# Patient Record
Sex: Female | Born: 1990 | Race: Black or African American | Hispanic: No | Marital: Single | Smoking: Current every day smoker
Health system: Southern US, Community
[De-identification: ages and names within clinical notes are randomized; demographics above are authoritative.]

## PROBLEM LIST (undated history)

## (undated) DIAGNOSIS — F32A Depression, unspecified: Secondary | ICD-10-CM

## (undated) DIAGNOSIS — F329 Major depressive disorder, single episode, unspecified: Secondary | ICD-10-CM

## (undated) HISTORY — PX: TYMPANOSTOMY TUBE PLACEMENT: SHX32

---
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
## 2016-12-19 ENCOUNTER — Emergency Department (HOSPITAL_BASED_OUTPATIENT_CLINIC_OR_DEPARTMENT_OTHER)
Admission: EM | Admit: 2016-12-19 | Discharge: 2016-12-19 | Disposition: A | Discharge status: Home / Self Care | Payer: Self-pay | Attending: Emergency Medicine | Admitting: Emergency Medicine

## 2016-12-19 ENCOUNTER — Encounter (HOSPITAL_BASED_OUTPATIENT_CLINIC_OR_DEPARTMENT_OTHER): Payer: Self-pay | Admitting: *Deleted

## 2016-12-19 DIAGNOSIS — M545 Low back pain, unspecified: Secondary | ICD-10-CM

## 2016-12-19 DIAGNOSIS — N3000 Acute cystitis without hematuria: Secondary | ICD-10-CM | POA: Insufficient documentation

## 2016-12-19 DIAGNOSIS — F17200 Nicotine dependence, unspecified, uncomplicated: Secondary | ICD-10-CM | POA: Insufficient documentation

## 2016-12-19 DIAGNOSIS — R350 Frequency of micturition: Secondary | ICD-10-CM | POA: Insufficient documentation

## 2016-12-19 LAB — URINALYSIS, MICROSCOPIC (REFLEX): RBC / HPF: NONE SEEN RBC/hpf (ref 0–5)

## 2016-12-19 LAB — URINALYSIS, ROUTINE W REFLEX MICROSCOPIC
BILIRUBIN URINE: NEGATIVE
Glucose, UA: NEGATIVE mg/dL
Hgb urine dipstick: NEGATIVE
Ketones, ur: NEGATIVE mg/dL
NITRITE: NEGATIVE
Protein, ur: NEGATIVE mg/dL
SPECIFIC GRAVITY, URINE: 1.015 (ref 1.005–1.030)
pH: 6 (ref 5.0–8.0)

## 2016-12-19 LAB — PREGNANCY, URINE: Preg Test, Ur: NEGATIVE

## 2016-12-19 MED ORDER — CEPHALEXIN 500 MG PO CAPS: ORAL_CAPSULE | ORAL | 0 refills | Status: DC

## 2016-12-19 MED ORDER — MELOXICAM 15 MG PO TABS: 15.0000 mg | ORAL_TABLET | Freq: Every day | ORAL | 0 refills | Status: DC

## 2016-12-19 NOTE — Discharge Instructions (Signed)
SEEK IMMEDIATE MEDICAL ATTENTION IF: °New numbness, tingling, weakness, or problem with the use of your arms or legs.  °Severe back pain not relieved with medications.  °Change in bowel or bladder control.  °Increasing pain in any areas of the body (such as chest or abdominal pain).  °Shortness of breath, dizziness or fainting.  °Nausea (feeling sick to your stomach), vomiting, fever, or sweats. ° °Please seek immediate care if you develop the following: °There is back pain.  °Your symptoms are no better or worse in 3 days. °There is severe back pain or lower abdominal pain.  °You develop chills.  °You have a fever.  °There is nausea or vomiting.  °There is continued burning or discomfort with urination.  ° °

## 2016-12-19 NOTE — ED Notes (Signed)
ED Provider at bedside. 

## 2016-12-19 NOTE — ED Triage Notes (Signed)
Back pain for a week. Stiffness. No known injury.

## 2016-12-19 NOTE — ED Provider Notes (Signed)
[facility]   CSN: [id number] Arrival date & time: 12/19/16  1805     History   Chief Complaint Chief Complaint  Patient presents with  . Back Pain    HPI Darlene Thomas is a 26 y.o. female.This is the emergency department with chief complaint of low back pain. She describes the pain as achy, localized over her tailbone and an low back. Worse with movement. She cannot think of anything she may have done to strain her back. She denies dysuria but has had some increased frequency of urination. She denies fevers or chills.Denies weakness, loss of bowel/bladder function or saddle anesthesia. Denies neck stiffness, headache, rash.  Denies fever or recent procedures to back.   HPI  History reviewed. No pertinent past medical history.  There are no active problems to display for this patient.   History reviewed. No pertinent surgical history.  OB History    No data available       Home Medications    Prior to Admission medications   Not on File    Family History No family history on file.  Social History Social History  Substance Use Topics  . Smoking status: Current Every Day Smoker  . Smokeless tobacco: Never Used  . Alcohol use No     Allergies   Patient has no known allergies.   Review of Systems Review of Systems  Ten systems reviewed and are negative for acute change, except as noted in the HPI.   Physical Exam Updated Vital Signs BP 128/73   Pulse 76   Temp 98.4 F (36.9 C) (Oral)   Resp 18   Ht  (1.702 m)   LMP 12/01/2016   SpO2 100%   Physical Exam  Constitutional: She is oriented to person, place, and time. She appears well-developed and well-nourished. No distress.  HENT:  Head: Normocephalic and atraumatic.  Eyes: Conjunctivae are normal. No scleral icterus.  Neck: Normal range of motion.  Cardiovascular: Normal rate, regular rhythm and normal heart sounds.  Exam reveals no gallop and no friction rub.     No murmur heard. Pulmonary/Chest: Effort normal and breath sounds normal. No respiratory distress.  Abdominal: Soft. Bowel sounds are normal. She exhibits no distension and no mass. There is no tenderness. There is no guarding.  Musculoskeletal:  Normal Range of motion of the back in all directions. Minimal tenderness over the sacrum and bilateral lumbar paraspinals. No CVA tenderness. Normal reflexes, normal strength neurovascularly intact in the lower extremities  Neurological: She is alert and oriented to person, place, and time.  Skin: Skin is warm and dry. She is not diaphoretic.  Psychiatric: Her behavior is normal.  Nursing note and vitals reviewed.    ED Treatments / Results  Labs (all labs ordered are listed, but only abnormal results are displayed) Labs Reviewed  URINALYSIS, ROUTINE W REFLEX MICROSCOPIC - Abnormal; Notable for the following:       Result Value   APPearance CLOUDY (*)    Leukocytes, UA LARGE (*)    All other components within normal limits  URINALYSIS, MICROSCOPIC (REFLEX) - Abnormal; Notable for the following:    Bacteria, UA MANY (*)    Squamous Epithelial / LPF 6-30 (*)    All other components within normal limits  PREGNANCY, URINE    EKG  EKG Interpretation None       Radiology No results found.  Procedures Procedures (including critical care time)  Medications Ordered in ED Medications -  No data to display   Initial Impression / Assessment and Plan / ED Course  I have reviewed the triage vital signs and the nursing notes.  Pertinent labs & imaging results that were available during my care of the patient were reviewed by me and considered in my medical decision making (see chart for details).     Patient with back pain.  No neurological deficits and normal neuro exam.  Patient can walk but states is painful.  No loss of bowel or bladder control.  No concern for cauda equina.  No fever, night sweats, weight loss, h/o cancer, IVDU.   RICE protocol and pain medicine indicated and discussed with patient.    Final Clinical Impressions(s) / ED Diagnoses   Final diagnoses:  Acute cystitis without hematuria  Acute bilateral low back pain without sciatica    New Prescriptions New Prescriptions   No medications on file     Arthor Captain, PA-C 12/19/16 2015    Charlynne Pander, MD 12/20/16 1500

## 2017-10-06 ENCOUNTER — Encounter (HOSPITAL_BASED_OUTPATIENT_CLINIC_OR_DEPARTMENT_OTHER): Payer: Self-pay

## 2017-10-06 ENCOUNTER — Other Ambulatory Visit: Payer: Self-pay

## 2017-10-06 ENCOUNTER — Emergency Department (HOSPITAL_BASED_OUTPATIENT_CLINIC_OR_DEPARTMENT_OTHER)
Admission: EM | Admit: 2017-10-06 | Discharge: 2017-10-06 | Disposition: A | Discharge status: Home / Self Care | Payer: Medicaid Other | Attending: Emergency Medicine | Admitting: Emergency Medicine

## 2017-10-06 DIAGNOSIS — L509 Urticaria, unspecified: Secondary | ICD-10-CM | POA: Insufficient documentation

## 2017-10-06 DIAGNOSIS — L299 Pruritus, unspecified: Secondary | ICD-10-CM | POA: Insufficient documentation

## 2017-10-06 DIAGNOSIS — R21 Rash and other nonspecific skin eruption: Secondary | ICD-10-CM | POA: Diagnosis present

## 2017-10-06 DIAGNOSIS — F17200 Nicotine dependence, unspecified, uncomplicated: Secondary | ICD-10-CM | POA: Insufficient documentation

## 2017-10-06 MED ORDER — FAMOTIDINE 40 MG PO TABS: 40.0000 mg | ORAL_TABLET | Freq: Every day | ORAL | 0 refills | Status: DC

## 2017-10-06 MED ORDER — PREDNISONE 20 MG PO TABS: 40.0000 mg | ORAL_TABLET | Freq: Every day | ORAL | 0 refills | Status: AC

## 2017-10-06 MED ORDER — PREDNISONE 50 MG PO TABS
60.0000 mg | ORAL_TABLET | Freq: Once | ORAL | Status: AC
  Administered 2017-10-06: 22:00:00 60 mg via ORAL
  Filled 2017-10-06: qty 1

## 2017-10-06 MED ORDER — FAMOTIDINE 20 MG PO TABS
20.0000 mg | ORAL_TABLET | Freq: Once | ORAL | Status: AC
  Administered 2017-10-06: 20 mg via ORAL
  Filled 2017-10-06: qty 1

## 2017-10-06 MED ORDER — DIPHENHYDRAMINE HCL 25 MG PO TABS: 25.0000 mg | ORAL_TABLET | Freq: Four times a day (QID) | ORAL | 0 refills | Status: DC | PRN

## 2017-10-06 NOTE — ED Triage Notes (Signed)
C/o itching to arms and legs-started last night-NAD-steady gait

## 2017-10-06 NOTE — ED Provider Notes (Signed)
Emergency Department Provider Note   I have reviewed the triage vital signs and the nursing notes.   HISTORY  Chief Complaint Pruritis   HPI Darlene Thomas is a 27 y.o. female with no significant past medical history presents to the emergency department for evaluation of itchy rash over the arms and legs.  She is tried supportive care at home with no relief in symptoms.  She denies any throat tightness, shortness of breath.  No fevers or chills.  No new medications.  No prior history of allergies.  He denies any abdominal pain.  She has not taken Benadryl for her itching.    History reviewed. No pertinent past medical history.  There are no active problems to display for this patient.   History reviewed. No pertinent surgical history.  Allergies Patient has no known allergies.  No family history on file.  Social History Social History   Tobacco Use  . Smoking status: Current Every Day Smoker  . Smokeless tobacco: Never Used  Substance Use Topics  . Alcohol use: No  . Drug use: No    Review of Systems  Constitutional: No fever/chills Eyes: No visual changes. ENT: No sore throat. Cardiovascular: Denies chest pain. Respiratory: Denies shortness of breath. Gastrointestinal: No abdominal pain.  No nausea, no vomiting.  No diarrhea.  No constipation. Genitourinary: Negative for dysuria. Musculoskeletal: Negative for back pain. Skin: Itchy rash.  Neurological: Negative for headaches, focal weakness or numbness.  10-point ROS otherwise negative.  ____________________________________________   PHYSICAL EXAM:  VITAL SIGNS: ED Triage Vitals  Enc Vitals Group     BP 10/06/17 2120 120/70     Pulse Rate 10/06/17 2120 (!) 102     Resp 10/06/17 2120 18     Temp 10/06/17 2120 98.4 F (36.9 C)     Temp Source 10/06/17 2120 Oral     SpO2 10/06/17 2120 99 %     Weight 10/06/17 2120 203 lb 0.7 oz (92.1 kg)     Height 10/06/17 2120 5\' 10"  (1.778 m)     Pain Score  10/06/17 2118 0   Constitutional: Alert and oriented. Well appearing and in no acute distress. Eyes: Conjunctivae are normal.  Head: Atraumatic. Nose: No congestion/rhinnorhea. Mouth/Throat: Mucous membranes are moist.  Oropharynx non-erythematous. Oropharynx is widely patent.  Neck: No stridor.  Cardiovascular: Normal rate, regular rhythm. Good peripheral circulation. Grossly normal heart sounds.   Respiratory: Normal respiratory effort.  No retractions. Lungs CTAB. Gastrointestinal: Soft and nontender. No distention.  Musculoskeletal: No lower extremity tenderness nor edema. No gross deformities of extremities. Neurologic:  Normal speech and language. No gross focal neurologic deficits are appreciated.  Skin:  Skin is warm, dry and intact. Urticaria over the arms, legs, and trunk.   ____________________________________________  RADIOLOGY  None ____________________________________________   PROCEDURES  Procedure(s) performed:   Procedures  None ____________________________________________   INITIAL IMPRESSION / ASSESSMENT AND PLAN / ED COURSE  Pertinent labs & imaging results that were available during my care of the patient were reviewed by me and considered in my medical decision making (see chart for details).  Patient with Urticaria. No concern for anaphylaxis. Plan for steroid burst and benadryl for itching. No abdominal pain or concern for severe drug reaction. Normal mucosal exam.   At this time, I do not feel there is any life-threatening condition present. I have reviewed and discussed all exam findings with patient. I have reviewed nursing notes and appropriate previous records.  I feel the patient is  safe to be discharged home without further emergent workup. Discussed usual and customary return precautions. Patient and family (if present) verbalize understanding and are comfortable with this plan.  Patient will follow-up with their primary care provider. If they  do not have a primary care provider, information for follow-up has been provided to them. All questions have been answered.    ____________________________________________  FINAL CLINICAL IMPRESSION(S) / ED DIAGNOSES  Final diagnoses:  Urticaria     MEDICATIONS GIVEN DURING THIS VISIT:  Medications  predniSONE (DELTASONE) tablet 60 mg (60 mg Oral Given 10/06/17 2209)  famotidine (PEPCID) tablet 20 mg (20 mg Oral Given 10/06/17 2209)     NEW OUTPATIENT MEDICATIONS STARTED DURING THIS VISIT:  Discharge Medication List as of 10/06/2017 10:31 PM    START taking these medications   Details  diphenhydrAMINE (BENADRYL) 25 MG tablet Take 1 tablet (25 mg total) by mouth every 6 (six) hours as needed for itching., Starting Tue 10/06/2017, Print    famotidine (PEPCID) 40 MG tablet Take 1 tablet (40 mg total) by mouth daily for 7 days., Starting Tue 10/06/2017, Until Tue 10/13/2017, Print    predniSONE (DELTASONE) 20 MG tablet Take 2 tablets (40 mg total) by mouth daily for 4 days., Starting Wed 10/07/2017, Until Sun 10/11/2017, Print        Note:  This document was prepared using Dragon voice recognition software and may include unintentional dictation errors.  Alona BeneJoshua Long, MD Emergency Medicine    Long, Arlyss RepressJoshua G, MD 10/07/17 [phone]

## 2017-10-06 NOTE — Discharge Instructions (Signed)
You have been seen in the Emergency Department (ED) today for an allergic rash.  You have been stable throughout your stay in the Emergency Department.  Please take your medications as prescribed and follow up with your doctor as indicated.  You should also take over-the-counter Benadryl around the clock for the next three days according to the dosing instructions on the package.  Return to the Emergency Department (ED) if you experience any worsening or new symptoms that concern you.

## 2017-10-17 ENCOUNTER — Encounter (HOSPITAL_BASED_OUTPATIENT_CLINIC_OR_DEPARTMENT_OTHER): Payer: Self-pay | Admitting: Emergency Medicine

## 2017-10-17 ENCOUNTER — Emergency Department (HOSPITAL_BASED_OUTPATIENT_CLINIC_OR_DEPARTMENT_OTHER)
Admission: EM | Admit: 2017-10-17 | Discharge: 2017-10-17 | Disposition: A | Discharge status: Home / Self Care | Payer: Medicaid Other | Attending: Emergency Medicine | Admitting: Emergency Medicine

## 2017-10-17 ENCOUNTER — Other Ambulatory Visit: Payer: Self-pay

## 2017-10-17 DIAGNOSIS — F17200 Nicotine dependence, unspecified, uncomplicated: Secondary | ICD-10-CM | POA: Diagnosis not present

## 2017-10-17 DIAGNOSIS — K0889 Other specified disorders of teeth and supporting structures: Secondary | ICD-10-CM | POA: Diagnosis present

## 2017-10-17 DIAGNOSIS — K047 Periapical abscess without sinus: Secondary | ICD-10-CM | POA: Diagnosis not present

## 2017-10-17 DIAGNOSIS — K029 Dental caries, unspecified: Secondary | ICD-10-CM | POA: Diagnosis not present

## 2017-10-17 DIAGNOSIS — Z79899 Other long term (current) drug therapy: Secondary | ICD-10-CM | POA: Insufficient documentation

## 2017-10-17 MED ORDER — NAPROXEN 500 MG PO TABS: 500.0000 mg | ORAL_TABLET | Freq: Two times a day (BID) | ORAL | 0 refills | Status: AC

## 2017-10-17 MED ORDER — PENICILLIN V POTASSIUM 500 MG PO TABS: 500.0000 mg | ORAL_TABLET | Freq: Four times a day (QID) | ORAL | 0 refills | Status: AC

## 2017-10-17 NOTE — Discharge Instructions (Signed)
Take antibiotics as directed. Please take all of your antibiotics until finished.  You can take Tylenol or Ibuprofen as directed for pain. You can alternate Tylenol and Ibuprofen every 4 hours. If you take Tylenol at 1pm, then you can take Ibuprofen at 5pm. Then you can take Tylenol again at 9pm.   The exam and treatment you received today has been provided on an emergency basis only. This is not a substitute for complete medical or dental care. If your problem worsens or new symptoms (problems) appear, and you are unable to arrange prompt follow-up care with your dentist, call or return to this location. If you do not have a dentist, please follow-up with one on the list provided  CALL YOUR DENTIST OR RETURN IMMEDIATELY IF you develop a fever, rash, difficulty breathing or swallowing, neck or facial swelling, or other potentially serious concerns.   Please follow-up with one of the dental clinics provided to you below or in your paperwork. Call and tell them you were seen in the Emergency Dept and arrange for an appointment. You may have to call multiple places in order to find a place to be seen.  Dental Assistance If the dentist on-call cannot see you, please use the resources below:   Patients with Medicaid: [facility] [address], [phone], [phone]  If unable to pay, or uninsured, contact [facility] ([phone]) or [facility] ([phone] in [place], [phone] in [place]) to become qualified for the adult dental clinic  Other Low-Cost Community Dental Services: [facility]- [address], [place], [place], [address]    [phone], Ext. [phone]    2nd and 4th Thursday of the month at 6:30am    10 clients each day by appointment, can sometimes see walk-in     patients if someone does not show for an appointment [facility]- [address], [place], [place], [address]    [phone]  [facility]- [address], [place], [place], [address]    [phone]  [facility]- [phone] [facility]- [phone] [facility]- [phone]  

## 2017-10-17 NOTE — ED Triage Notes (Signed)
R side dental pain since yesterday.  

## 2017-10-17 NOTE — ED Provider Notes (Signed)
[facility] EMERGENCY DEPARTMENT Provider Note   CSN: [id number] Arrival date & time: 10/17/17  1210     History   Chief Complaint Chief Complaint  Patient presents with  . Dental Pain    HPI Darlene Cowperaneika Thomas is a 27 y.o. female who presents for evaluation of right-sided dental pain that began yesterday.  Patient reports that the pain radiates into her right face.  She reports she has been able to eat and drink without any difficulty though does report worsening pain with eating.  She states she has been able to tolerate her secretions without any difficulty.  She reports she is taking Motrin for the pain with minimal improvement.  Patient states she does not have a dentist.  Patient denies any fevers, diplopia swelling, difficulty breathing, vomiting.  The history is provided by the patient.    History reviewed. No pertinent past medical history.  There are no active problems to display for this patient.   History reviewed. No pertinent surgical history.   OB History   None      Home Medications    Prior to Admission medications   Medication Sig Start Date End Date Taking? Authorizing Provider  cephALEXin (KEFLEX) 500 MG capsule 2 caps po bid x 7 days 12/19/16   Arthor CaptainHarris, Abigail, PA-C  diphenhydrAMINE (BENADRYL) 25 MG tablet Take 1 tablet (25 mg total) by mouth every 6 (six) hours as needed for itching. 10/06/17   Long, Arlyss RepressJoshua G, MD  famotidine (PEPCID) 40 MG tablet Take 1 tablet (40 mg total) by mouth daily for 7 days. 10/06/17 10/13/17  Long, Arlyss RepressJoshua G, MD  meloxicam (MOBIC) 15 MG tablet Take 1 tablet (15 mg total) by mouth daily. Take 1 daily with food. 12/19/16   Harris, Cammy CopaAbigail, PA-C  naproxen (NAPROSYN) 500 MG tablet Take 1 tablet (500 mg total) by mouth 2 (two) times daily for 7 days. 10/17/17 10/24/17  Graciella FreerLayden, Waniya Hoglund A, PA-C  penicillin v potassium (VEETID) 500 MG tablet Take 1 tablet (500 mg total) by mouth 4 (four) times daily for 7 days. 10/17/17 10/24/17  Maxwell CaulLayden,  Halen Mossbarger A, PA-C    Family History No family history on file.  Social History Social History   Tobacco Use  . Smoking status: Current Every Day Smoker  . Smokeless tobacco: Never Used  Substance Use Topics  . Alcohol use: No  . Drug use: No     Allergies   Patient has no known allergies.   Review of Systems Review of Systems  Constitutional: Negative for fever.  HENT: Positive for dental problem. Negative for facial swelling and trouble swallowing.   Respiratory: Negative for shortness of breath.   Gastrointestinal: Negative for vomiting.  All other systems reviewed and are negative.    Physical Exam Updated Vital Signs BP 124/81 (BP Location: Left Arm)   Pulse 90   Temp 98.6 F (37 C) (Oral)   Resp 18   Ht 5\' 10"  (1.778 m)   Wt 89.8 kg (198 lb)   LMP 09/26/2017   SpO2 100%   BMI 28.41 kg/m   Physical Exam  Constitutional: She appears well-developed and well-nourished.  HENT:  Head: Normocephalic and atraumatic.  Mouth/Throat: Uvula is midline, oropharynx is clear and moist and mucous membranes are normal. No trismus in the jaw. Abnormal dentition. Dental caries present.    Airways patent, phonation is intact.  No oral angioedema.  Diffuse dental caries throughout entire mouth.  No obvious area of fluctuance or abscess.  Eyes:  Conjunctivae and EOM are normal. Right eye exhibits no discharge. Left eye exhibits no discharge. No scleral icterus.  Pulmonary/Chest: Effort normal.  Neurological: She is alert.  Skin: Skin is warm and dry.  Psychiatric: She has a normal mood and affect. Her speech is normal and behavior is normal.  Nursing note and vitals reviewed.    ED Treatments / Results  Labs (all labs ordered are listed, but only abnormal results are displayed) Labs Reviewed - No data to display  EKG None  Radiology No results found.  Procedures Procedures (including critical care time)  Medications Ordered in ED Medications - No data to  display   Initial Impression / Assessment and Plan / ED Course  I have reviewed the triage vital signs and the nursing notes.  Pertinent labs & imaging results that were available during my care of the patient were reviewed by me and considered in my medical decision making (see chart for details).     27 y.o.  F presents with 1 day of dental pain. No evidence of abscess requiring immediate incision and drainage. Exam not concerning for Ludwig's angina or pharyngeal abscess. Will treat with Penicillin.. Patient instructed to follow-up with dentist referral provided. Stable for discharge at this time. Strict return precautions discussed. Patient expresses understanding and agreement to plan.  Final Clinical Impressions(s) / ED Diagnoses   Final diagnoses:  Dental abscess  Dental caries    ED Discharge Orders        Ordered    penicillin v potassium (VEETID) 500 MG tablet  4 times daily     10/17/17 1242    naproxen (NAPROSYN) 500 MG tablet  2 times daily     10/17/17 1242       Rosana Hoes 10/17/17 1317    Tilden Fossa, MD 10/18/17 [phone]

## 2019-08-15 ENCOUNTER — Other Ambulatory Visit: Payer: Self-pay

## 2019-08-15 ENCOUNTER — Emergency Department (HOSPITAL_BASED_OUTPATIENT_CLINIC_OR_DEPARTMENT_OTHER)
Admission: EM | Admit: 2019-08-15 | Discharge: 2019-08-15 | Disposition: A | Discharge status: Home / Self Care | Payer: Medicaid Other | Attending: Emergency Medicine | Admitting: Emergency Medicine

## 2019-08-15 ENCOUNTER — Encounter (HOSPITAL_BASED_OUTPATIENT_CLINIC_OR_DEPARTMENT_OTHER): Payer: Self-pay | Admitting: Emergency Medicine

## 2019-08-15 DIAGNOSIS — K0889 Other specified disorders of teeth and supporting structures: Secondary | ICD-10-CM | POA: Diagnosis present

## 2019-08-15 DIAGNOSIS — K029 Dental caries, unspecified: Secondary | ICD-10-CM | POA: Insufficient documentation

## 2019-08-15 DIAGNOSIS — F17210 Nicotine dependence, cigarettes, uncomplicated: Secondary | ICD-10-CM | POA: Diagnosis not present

## 2019-08-15 DIAGNOSIS — K047 Periapical abscess without sinus: Secondary | ICD-10-CM | POA: Diagnosis not present

## 2019-08-15 DIAGNOSIS — R202 Paresthesia of skin: Secondary | ICD-10-CM | POA: Diagnosis not present

## 2019-08-15 DIAGNOSIS — Z79899 Other long term (current) drug therapy: Secondary | ICD-10-CM | POA: Diagnosis not present

## 2019-08-15 HISTORY — DX: Depression, unspecified: F32.A

## 2019-08-15 MED ORDER — PENICILLIN V POTASSIUM 500 MG PO TABS: 500.0000 mg | ORAL_TABLET | Freq: Four times a day (QID) | ORAL | 0 refills | Status: AC

## 2019-08-15 NOTE — ED Provider Notes (Signed)
[facility] EMERGENCY DEPARTMENT Provider Note   CSN: [id number] Arrival date & time: 08/15/19  2706     History Chief Complaint  Patient presents with  . Dental Pain    Darlene Thomas is a 29 y.o. female.  The history is provided by the patient and medical records. No language interpreter was used.  Dental Pain Location:  Lower Lower teeth location:  17/LL 3rd molar and 18/LL 2nd molar Quality:  Aching and dull Severity:  Moderate Onset quality:  Gradual Duration:  3 days Timing:  Constant Progression:  Unchanged Chronicity:  New Context: dental caries and poor dentition   Relieved by:  Nothing Worsened by:  Nothing Ineffective treatments:  Acetaminophen Associated symptoms: no congestion, no difficulty swallowing, no drooling, no facial pain, no facial swelling, no fever, no headaches, no neck pain, no neck swelling and no trismus        No past medical history on file.  There are no problems to display for this patient.   No past surgical history on file.   OB History   No obstetric history on file.     No family history on file.  Social History   Tobacco Use  . Smoking status: Current Every Day Smoker  . Smokeless tobacco: Never Used  Substance Use Topics  . Alcohol use: No  . Drug use: No    Home Medications Prior to Admission medications   Medication Sig Start Date End Date Taking? Authorizing Provider  cephALEXin (KEFLEX) 500 MG capsule 2 caps po bid x 7 days 12/19/16   Arthor Captain, PA-C  diphenhydrAMINE (BENADRYL) 25 MG tablet Take 1 tablet (25 mg total) by mouth every 6 (six) hours as needed for itching. 10/06/17   Long, Arlyss Repress, MD  famotidine (PEPCID) 40 MG tablet Take 1 tablet (40 mg total) by mouth daily for 7 days. 10/06/17 10/13/17  Long, Arlyss Repress, MD  meloxicam (MOBIC) 15 MG tablet Take 1 tablet (15 mg total) by mouth daily. Take 1 daily with food. 12/19/16   Arthor Captain, PA-C    Allergies    Patient has no known  allergies.  Review of Systems   Review of Systems  Constitutional: Negative for chills, diaphoresis, fatigue and fever.  HENT: Positive for dental problem. Negative for congestion, drooling, ear discharge, ear pain, facial swelling and rhinorrhea.   Eyes: Negative for photophobia, pain and visual disturbance.  Respiratory: Negative for cough, chest tightness, shortness of breath and wheezing.   Cardiovascular: Negative for chest pain and palpitations.  Gastrointestinal: Negative for abdominal pain, constipation, diarrhea, nausea and vomiting.  Genitourinary: Negative for dysuria and frequency.  Musculoskeletal: Negative for back pain, neck pain and neck stiffness.  Skin: Negative for rash and wound.  Neurological: Negative for dizziness, tremors, facial asymmetry, speech difficulty, weakness, light-headedness, numbness and headaches.  Psychiatric/Behavioral: Negative for agitation and confusion.  All other systems reviewed and are negative.   Physical Exam Updated Vital Signs BP (!) 144/101 (BP Location: Right Arm)   Pulse 73   Temp 98.2 F (36.8 C) (Oral)   Resp 16   Ht 5\' 9"  (1.753 m)   Wt 81.6 kg   SpO2 98%   BMI 26.58 kg/m   Physical Exam Vitals and nursing note reviewed.  Constitutional:      General: She is not in acute distress.    Appearance: She is well-developed. She is not ill-appearing, toxic-appearing or diaphoretic.  HENT:     Head: Normocephalic and atraumatic.  Jaw: Tenderness present. No trismus, swelling, pain on movement or malocclusion.     Salivary Glands: Right salivary gland is not diffusely enlarged or tender. Left salivary gland is not diffusely enlarged or tender.      Nose: Nose normal. No congestion or rhinorrhea.     Mouth/Throat:     Mouth: Mucous membranes are moist. No angioedema.     Dentition: Abnormal dentition. Dental tenderness and dental caries present. No gum lesions.     Tongue: Tongue does not deviate from midline.     Palate:  No mass.     Pharynx: Oropharynx is clear. No pharyngeal swelling, oropharyngeal exudate, posterior oropharyngeal erythema or uvula swelling.     Tonsils: No tonsillar exudate or tonsillar abscesses.   Eyes:     Extraocular Movements: Extraocular movements intact.     Conjunctiva/sclera: Conjunctivae normal.     Pupils: Pupils are equal, round, and reactive to light.  Cardiovascular:     Rate and Rhythm: Normal rate.     Pulses: Normal pulses.     Heart sounds: No murmur.  Pulmonary:     Effort: Pulmonary effort is normal. No respiratory distress.     Breath sounds: Normal breath sounds. No wheezing, rhonchi or rales.  Chest:     Chest wall: No tenderness.  Abdominal:     Palpations: Abdomen is soft.     Tenderness: There is no abdominal tenderness. There is no right CVA tenderness, left CVA tenderness, guarding or rebound.  Musculoskeletal:        General: No tenderness.     Cervical back: Neck supple. No tenderness.  Skin:    General: Skin is warm and dry.     Capillary Refill: Capillary refill takes less than 2 seconds.     Findings: No erythema.  Neurological:     General: No focal deficit present.     Mental Status: She is alert.     Sensory: No sensory deficit.     Motor: No weakness.  Psychiatric:        Mood and Affect: Mood normal.     ED Results / Procedures / Treatments   Labs (all labs ordered are listed, but only abnormal results are displayed) Labs Reviewed - No data to display  EKG None  Radiology No results found.  Procedures Procedures (including critical care time)  Medications Ordered in ED Medications - No data to display  ED Course  I have reviewed the triage vital signs and the nursing notes.  Pertinent labs & imaging results that were available during my care of the patient were reviewed by me and considered in my medical decision making (see chart for details).    MDM Rules/Calculators/A&P                      Darlene Thomas is a 29  y.o. female with a past medical history significant for prior dental abscess and dental caries who presents with left lower dental pain.  She reports that she was scheduled to have several wisdom teeth removed on Friday, several days ago but due to a technical error with the system, all of the surgeries got canceled for the day.  She reports she is going to reschedule it soon however over the last few days started having worsening dental pain on the left side.  She reports having some numbness and tingling to the left lower mouth and lip area.  She reports the pain is moderate and feels  like it is in the jaw.  She denies any ability swallowing or breathing.  No neck pain or neck stiffness.  No fevers, chills, visual changes, or significant headaches.  She denies any respiratory symptoms or GI symptoms.  She reports feels like dental infection in the past.  She denies any drainage into her mouth or swelling on her face.  On exam, patient had some pain in the location of her left posterior molars.  There is no erythema or drainage seen.  No swelling that would be amenable to incision and drainage.  No evidence of Ludwig's angina.  Tongue was not swollen.  No lip swelling.  Uvula midline.  No evidence of RPA or PTA.  No stridor.  Lungs clear and chest nontender.  Abdomen nontender.  Normal extraocular movements.  Sinuses are nontender.  No trismus.  Normal neck range of motion.  No neck tenderness otherwise.  She did not have any facial droop or numbness on my exam.  Clinically I suspect a dental infection and abscess in the left lower jaw.  As penicillin worked well last time, we will try this again as it does have a different location.  Patient was instructed she needs to call her dentist on the morning to schedule the surgery soon and if any symptoms change or worsen acutely, she is return to the nearest emergency department.  Patient is agreeable to this and I have low suspicion for worsening deep space neck  infection or other abnormality.  Low suspicion for stroke.  No low suspicion for other osteomyelitis or bony infection of the face.  Given her otherwise well appearance and reassuring vital signs, I feel she safe for discharge home and patient agrees.  Patient discharged in good condition with understanding of plan of care.   Final Clinical Impression(s) / ED Diagnoses Final diagnoses:  Dental infection  Pain, dental  Paresthesia    Rx / DC Orders ED Discharge Orders         Ordered    penicillin v potassium (VEETID) 500 MG tablet  4 times daily     08/15/19 0833          Clinical Impression: 1. Dental infection   2. Pain, dental   3. Paresthesia     Disposition: Discharge  Condition: Good  I have discussed the results, Dx and Tx plan with the pt(& family if present). He/she/they expressed understanding and agree(s) with the plan. Discharge instructions discussed at great length. Strict return precautions discussed and pt &/or family have verbalized understanding of the instructions. No further questions at time of discharge.    New Prescriptions   PENICILLIN V POTASSIUM (VEETID) 500 MG TABLET    Take 1 tablet (500 mg total) by mouth 4 (four) times daily for 7 days.    Follow Up: Call your dentist tomorrow morning        Taquana Bartley, Gwenyth Allegra, MD 08/15/19 [phone]

## 2019-08-15 NOTE — Discharge Instructions (Signed)
Your history and exam are consistent with a recurrent dental infection this time on the left side with the molars.  I know you are scheduled to get your teeth removed on Friday so please call them tomorrow to discuss rescheduling as I am concerned they do need to be removed.  Please take the antibiotics for the next week as well as anti-inflammatory medication.  Please rest and stay hydrated.  If any symptoms change or worsen significantly, please return to the nearest emergency department.

## 2019-08-15 NOTE — ED Triage Notes (Addendum)
Was to have wisdom teeth out on Friday and they could not do it due to system issue  Now bottom lip swelling and face hurts, pt handling secreations well  No drooling, pt states she does smoke but did not  last night

## 2019-10-29 ENCOUNTER — Encounter (HOSPITAL_BASED_OUTPATIENT_CLINIC_OR_DEPARTMENT_OTHER): Payer: Self-pay

## 2019-10-29 ENCOUNTER — Emergency Department (HOSPITAL_BASED_OUTPATIENT_CLINIC_OR_DEPARTMENT_OTHER): Payer: Medicaid Other

## 2019-10-29 ENCOUNTER — Other Ambulatory Visit: Payer: Self-pay

## 2019-10-29 ENCOUNTER — Emergency Department (HOSPITAL_BASED_OUTPATIENT_CLINIC_OR_DEPARTMENT_OTHER)
Admission: EM | Admit: 2019-10-29 | Discharge: 2019-10-29 | Disposition: A | Discharge status: Home / Self Care | Payer: Medicaid Other | Attending: Emergency Medicine | Admitting: Emergency Medicine

## 2019-10-29 DIAGNOSIS — M542 Cervicalgia: Secondary | ICD-10-CM | POA: Diagnosis not present

## 2019-10-29 DIAGNOSIS — K1120 Sialoadenitis, unspecified: Secondary | ICD-10-CM | POA: Diagnosis not present

## 2019-10-29 DIAGNOSIS — H9201 Otalgia, right ear: Secondary | ICD-10-CM | POA: Diagnosis present

## 2019-10-29 DIAGNOSIS — F17200 Nicotine dependence, unspecified, uncomplicated: Secondary | ICD-10-CM | POA: Insufficient documentation

## 2019-10-29 DIAGNOSIS — R221 Localized swelling, mass and lump, neck: Secondary | ICD-10-CM | POA: Insufficient documentation

## 2019-10-29 LAB — CBC WITH DIFFERENTIAL/PLATELET
Abs Immature Granulocytes: 0.01 10*3/uL (ref 0.00–0.07)
Basophils Absolute: 0 10*3/uL (ref 0.0–0.1)
Basophils Relative: 1 %
Eosinophils Absolute: 0.1 10*3/uL (ref 0.0–0.5)
Eosinophils Relative: 1 %
HCT: 41.6 % (ref 36.0–46.0)
Hemoglobin: 13.2 g/dL (ref 12.0–15.0)
Immature Granulocytes: 0 %
Lymphocytes Relative: 40 %
Lymphs Abs: 2.6 10*3/uL (ref 0.7–4.0)
MCH: 28 pg (ref 26.0–34.0)
MCHC: 31.7 g/dL (ref 30.0–36.0)
MCV: 88.1 fL (ref 80.0–100.0)
Monocytes Absolute: 0.3 10*3/uL (ref 0.1–1.0)
Monocytes Relative: 5 %
Neutro Abs: 3.5 10*3/uL (ref 1.7–7.7)
Neutrophils Relative %: 53 %
Platelets: 308 10*3/uL (ref 150–400)
RBC: 4.72 MIL/uL (ref 3.87–5.11)
RDW: 13.2 % (ref 11.5–15.5)
WBC: 6.5 10*3/uL (ref 4.0–10.5)
nRBC: 0 % (ref 0.0–0.2)

## 2019-10-29 LAB — BASIC METABOLIC PANEL
Anion gap: 9 (ref 5–15)
BUN: 7 mg/dL (ref 6–20)
CO2: 24 mmol/L (ref 22–32)
Calcium: 9.2 mg/dL (ref 8.9–10.3)
Chloride: 104 mmol/L (ref 98–111)
Creatinine, Ser: 0.82 mg/dL (ref 0.44–1.00)
GFR calc Af Amer: >60 mL/min (ref >60)
GFR calc non Af Amer: >60 mL/min (ref >60)
Glucose, Bld: 92 mg/dL (ref 70–99)
Potassium: 4.4 mmol/L (ref 3.5–5.1)
Sodium: 137 mmol/L (ref 135–145)

## 2019-10-29 MED ORDER — AMOXICILLIN-POT CLAVULANATE 875-125 MG PO TABS
1.0000 | ORAL_TABLET | Freq: Once | ORAL | Status: AC
Start: 2019-10-29 — End: 2019-10-29
  Administered 2019-10-29: 1 via ORAL
  Filled 2019-10-29: qty 1

## 2019-10-29 MED ORDER — KETOROLAC TROMETHAMINE 30 MG/ML IJ SOLN
30.0000 mg | Freq: Once | INTRAMUSCULAR | Status: AC
  Administered 2019-10-29: 30 mg via INTRAVENOUS
  Filled 2019-10-29: qty 1

## 2019-10-29 MED ORDER — AMOXICILLIN-POT CLAVULANATE 875-125 MG PO TABS: 1.0000 | ORAL_TABLET | Freq: Two times a day (BID) | ORAL | 0 refills | Status: AC

## 2019-10-29 MED ORDER — IOHEXOL 300 MG/ML  SOLN
100.0000 mL | Freq: Once | INTRAMUSCULAR | Status: AC | PRN
  Administered 2019-10-29: 75 mL via INTRAVENOUS

## 2019-10-29 NOTE — ED Provider Notes (Signed)
[facility] EMERGENCY DEPARTMENT Provider Note   CSN: [id number] Arrival date & time: 10/29/19  1415     History Chief Complaint  Patient presents with  . Otalgia    Darlene Thomas is a 29 y.o. female presenting to the ED with 2 days of worsening right sided neck pain and swelling.  She states she has swelling below her ear with some mild pain into her ear.  She is able to move her jaw without difficulty.  She denies sore throat, fevers, chills, dental pain.  She is up-to-date on all immunizations.  She has not treated her symptoms with any medications.  The history is provided by the patient.       Past Medical History:  Diagnosis Date  . Depression     There are no problems to display for this patient.   History reviewed. No pertinent surgical history.   OB History   No obstetric history on file.     No family history on file.  Social History   Tobacco Use  . Smoking status: Current Every Day Smoker  . Smokeless tobacco: Never Used  Vaping Use  . Vaping Use: Never used  Substance Use Topics  . Alcohol use: No  . Drug use: No    Home Medications Prior to Admission medications   Medication Sig Start Date End Date Taking? Authorizing Provider  amoxicillin-clavulanate (AUGMENTIN) 875-125 MG tablet Take 1 tablet by mouth every 12 (twelve) hours for 10 days. 10/29/19 11/08/19  Nelsie Domino, [place] N, PA-C  cephALEXin (KEFLEX) 500 MG capsule 2 caps po bid x 7 days 12/19/16   Arthor Captain, PA-C  diphenhydrAMINE (BENADRYL) 25 MG tablet Take 1 tablet (25 mg total) by mouth every 6 (six) hours as needed for itching. 10/06/17   Long, Arlyss Repress, MD  famotidine (PEPCID) 40 MG tablet Take 1 tablet (40 mg total) by mouth daily for 7 days. 10/06/17 10/13/17  Long, Arlyss Repress, MD  meloxicam (MOBIC) 15 MG tablet Take 1 tablet (15 mg total) by mouth daily. Take 1 daily with food. 12/19/16   Arthor Captain, PA-C    Allergies    Patient has no known allergies.  Review of  Systems   Review of Systems  Constitutional: Negative for fever.  HENT: Positive for ear pain. Negative for ear discharge, sore throat and trouble swallowing.        Neck pain/swelling  Respiratory: Negative for shortness of breath and stridor.   All other systems reviewed and are negative.   Physical Exam Updated Vital Signs BP 132/81 (BP Location: Right Arm)   Pulse 74   Temp 98.3 F (36.8 C) (Oral)   Resp 20   Ht 5\' 9"  (1.753 m)   Wt 86.2 kg   LMP 10/15/2019   SpO2 100%   BMI 28.06 kg/m   Physical Exam Vitals and nursing note reviewed.  Constitutional:      General: She is not in acute distress.    Appearance: She is well-developed. She is not ill-appearing.  HENT:     Head: Normocephalic and atraumatic.      Comments: Right TM is normal in appearance, Right ext ear canal is normal. There is swelling tender mass just inferior to right ear lobe on the neck, posterior to ankle of the mandible. No overlying skin changes. No swelling or tenderness anterior to the tragus. No obvious fluctuance. Anterior cervical lymphadenopathy is present. Normal movement of the jaw, no trismus. No posterior oropharyngeal erythema or swelling, no  exudates, uvula is midline. Some dental decay is present to right lower last molar Eyes:     Conjunctiva/sclera: Conjunctivae normal.  Cardiovascular:     Rate and Rhythm: Normal rate.  Pulmonary:     Effort: Pulmonary effort is normal.     Breath sounds: Normal breath sounds. No stridor.  Abdominal:     Palpations: Abdomen is soft.  Skin:    General: Skin is warm.  Neurological:     Mental Status: She is alert.  Psychiatric:        Behavior: Behavior normal.     ED Results / Procedures / Treatments   Labs (all labs ordered are listed, but only abnormal results are displayed) Labs Reviewed  BASIC METABOLIC PANEL  CBC WITH DIFFERENTIAL/PLATELET    EKG None  Radiology CT Soft Tissue Neck W Contrast  Result Date: 10/29/2019 CLINICAL  DATA:  Lymphadenopathy EXAM: CT NECK WITH CONTRAST TECHNIQUE: Multidetector CT imaging of the neck was performed using the standard protocol following the bolus administration of intravenous contrast. CONTRAST:  18mL OMNIPAQUE IOHEXOL 300 MG/ML  SOLN COMPARISON:  None. FINDINGS: Pharynx and larynx: Normal. No mass or swelling. Clear parapharyngeal spaces. Salivary glands: Normal appearance of the left parotid and bilateral submandibular glands. Asymmetric enlargement of the right parotid gland with diffuse intraparotid stranding. Prominent intraparotid nodes measure up to 6 mm (3:43). Thyroid: Prominent thyroid.  No focal lesion. Lymph nodes: Bilateral level 1B and right level 2A/2B nodes. Reniform hyperdensity within the right level 1 B region measuring 1.7 x 1.3 cm (3:50) likely reflects an enlarged node. Prominent right level 3, 4 and 5 nodes. Vascular: Normal intravascular enhancement. Limited intracranial: Grossly unremarkable. Visualized orbits: Normal orbits. Mastoids and visualized paranasal sinuses: Layering bilateral maxillary and right sphenoid sinus secretions likely reflects active inflammation. Mild ethmoid sinus mucosal thickening. Bilateral mastoid effusions. Skeleton: No acute or suspicious osseous abnormalities. Upper chest: Atelectasis. Other: None. IMPRESSION: Sequela of right parotiditis. Right predominant cervical adenopathy is likely reactive. Enlarged right level 1B node measuring 1.7 cm likely reflects a reactive node. Follow-up imaging in 4-6 weeks is recommended to ensure resolution of adenopathy. If adenopathy persists consider tissue sampling. Electronically Signed   By: Stana Bunting M.D.   On: 10/29/2019 19:05    Procedures Procedures (including critical care time)  Medications Ordered in ED Medications  amoxicillin-clavulanate (AUGMENTIN) 875-125 MG per tablet 1 tablet (has no administration in time range)  ketorolac (TORADOL) 30 MG/ML injection 30 mg (30 mg Intravenous  Given 10/29/19 1737)  iohexol (OMNIPAQUE) 300 MG/ML solution 100 mL (75 mLs Intravenous Contrast Given 10/29/19 1819)    ED Course  I have reviewed the triage vital signs and the nursing notes.  Pertinent labs & imaging results that were available during my care of the patient were reviewed by me and considered in my medical decision making (see chart for details).    MDM Rules/Calculators/A&P                          Patient presenting with right-sided otalgia and neck swelling x2 days.  No systemic symptoms, no fevers or chills.  On exam, she is afebrile stable vital signs.  Tenderness just below right ear with swelling and anterior cervical adenopathy.  CT scan reveals findings consistent with parotiditis with reactive adenopathy.  No evidence of salivary stone on exam of the mouth.  As patient is very well-appearing, believe she is appropriate for outpatient management.  Will prescribe Augmentin,  and provide ENT referral as needed for follow-up.  Encouraged she eat sour candies to encourage salivation, symptomatic management, oral hydration.  Return precautions discussed.  Patient is agreeable to plan, appropriate for discharge.  Final Clinical Impression(s) / ED Diagnoses Final diagnoses:  Parotiditis    Rx / DC Orders ED Discharge Orders         Ordered    amoxicillin-clavulanate (AUGMENTIN) 875-125 MG tablet  Every 12 hours     Discontinue  Reprint     10/29/19 1931           Terris Germano, [place] N, PA-C 10/29/19 1937    Pollyann Savoy, MD 10/29/19 2226

## 2019-10-29 NOTE — ED Triage Notes (Signed)
Pt c/o R ear ache and swelling under ear x 2 days.

## 2019-10-29 NOTE — ED Notes (Signed)
Pharmacy and medications updated with patient. Pt denies taking any medications at this time 

## 2019-10-29 NOTE — Discharge Instructions (Signed)
It is important you stay hydrated. Beginning tomorrow, take the antibiotic every 12 hours until gone. You can treat your symptoms with ibuprofen every 6 hours. Follow with the ear nose and throat specialist to confirm resolution. Return the emergency department should symptoms significantly worsen.

## 2019-10-29 NOTE — ED Notes (Signed)
Pt discharged to home. Discharge instructions have been discussed with patient and/or family members. Pt verbally acknowledges understanding d/c instructions, and endorses comprehension to checkout at registration before leaving.  °

## 2020-12-13 ENCOUNTER — Other Ambulatory Visit: Payer: Self-pay

## 2020-12-13 ENCOUNTER — Encounter (HOSPITAL_BASED_OUTPATIENT_CLINIC_OR_DEPARTMENT_OTHER): Payer: Self-pay | Admitting: Emergency Medicine

## 2020-12-13 ENCOUNTER — Emergency Department (HOSPITAL_BASED_OUTPATIENT_CLINIC_OR_DEPARTMENT_OTHER)
Admission: EM | Admit: 2020-12-13 | Discharge: 2020-12-13 | Disposition: A | Discharge status: Home / Self Care | Payer: Medicaid Other | Attending: Emergency Medicine | Admitting: Emergency Medicine

## 2020-12-13 DIAGNOSIS — J3489 Other specified disorders of nose and nasal sinuses: Secondary | ICD-10-CM | POA: Diagnosis not present

## 2020-12-13 DIAGNOSIS — H66001 Acute suppurative otitis media without spontaneous rupture of ear drum, right ear: Secondary | ICD-10-CM | POA: Diagnosis not present

## 2020-12-13 DIAGNOSIS — F17200 Nicotine dependence, unspecified, uncomplicated: Secondary | ICD-10-CM | POA: Insufficient documentation

## 2020-12-13 DIAGNOSIS — R0981 Nasal congestion: Secondary | ICD-10-CM | POA: Diagnosis not present

## 2020-12-13 DIAGNOSIS — H9203 Otalgia, bilateral: Secondary | ICD-10-CM | POA: Diagnosis present

## 2020-12-13 MED ORDER — AMOXICILLIN-POT CLAVULANATE 875-125 MG PO TABS: 1.0000 | ORAL_TABLET | Freq: Two times a day (BID) | ORAL | 0 refills | Status: AC

## 2020-12-13 MED ORDER — FLUTICASONE PROPIONATE 50 MCG/ACT NA SUSP: 2.0000 | Freq: Every day | NASAL | 0 refills | Status: AC

## 2020-12-13 NOTE — ED Provider Notes (Signed)
[facility] EMERGENCY DEPARTMENT Provider Note   CSN: [id number] Arrival date & time: 12/13/20  2878     History Chief Complaint  Patient presents with   Otalgia    Darlene Thomas is a 30 y.o. female resenting to the ED for bilateral ear pain for the past 2 days.  Patient reports that she is having some aching and throbbing pain with muffled hearing.  She denies any fever, dental pain, rhinorrhea, nasal congestion, eye pain, eye discharge, sore throat, cough, shortness of breath, chest pain, or neck pain.  She denies any recent swimming or water activity.  She denies any medical history.  Surgical history of TM tubes.  Denies any daily medications.  Denies any medication allergies.  Patient is a daily smoker, but denies any EtOH or illicit drug use.   Otalgia Associated symptoms: no abdominal pain, no congestion, no cough, no fever, no rash, no rhinorrhea, no sore throat, no tinnitus and no vomiting       Past Medical History:  Diagnosis Date   Depression     There are no problems to display for this patient.   Past Surgical History:  Procedure Laterality Date   TYMPANOSTOMY TUBE PLACEMENT       OB History   No obstetric history on file.     No family history on file.  Social History   Tobacco Use   Smoking status: Every Day   Smokeless tobacco: Never  Vaping Use   Vaping Use: Never used  Substance Use Topics   Alcohol use: No   Drug use: No    Home Medications Prior to Admission medications   Medication Sig Start Date End Date Taking? Authorizing Provider  amoxicillin-clavulanate (AUGMENTIN) 875-125 MG tablet Take 1 tablet by mouth every 12 (twelve) hours. 12/13/20  Yes Achille Rich, PA-C  fluticasone [facility]) 50 MCG/ACT nasal spray Place 2 sprays into both nostrils daily. 12/13/20  Yes Achille Rich, PA-C    Allergies    Patient has no known allergies.  Review of Systems   Review of Systems  Constitutional:  Negative for chills and fever.   HENT:  Positive for ear pain. Negative for congestion, dental problem, rhinorrhea, sinus pressure, sore throat and tinnitus.   Eyes:  Negative for pain, discharge, redness, itching and visual disturbance.  Respiratory:  Negative for cough and shortness of breath.   Cardiovascular:  Negative for chest pain and palpitations.  Gastrointestinal:  Negative for abdominal pain and vomiting.  Genitourinary:  Negative for dysuria and hematuria.  Musculoskeletal:  Negative for arthralgias and back pain.  Skin:  Negative for color change and rash.  Neurological:  Negative for seizures and syncope.  All other systems reviewed and are negative.  Physical Exam Updated Vital Signs BP (!) 132/91 (BP Location: Right Arm)   Pulse 73   Temp (!) 97.5 F (36.4 C) (Oral)   Resp 16   Ht 5\' 9"  (1.753 m)   Wt 93.4 kg   LMP 12/10/2020 (Approximate)   SpO2 97%   BMI 30.42 kg/m   Physical Exam Constitutional:      General: She is not in acute distress.    Appearance: Normal appearance. She is not toxic-appearing.  HENT:     Head: Normocephalic and atraumatic.     Right Ear: Ear canal and external ear normal.     Left Ear: Ear canal and external ear normal.     Ears:     Comments: Right TM mild erythema with some  effusion.  External ear canal normal.  No foreign bodies noted.  Left TM mild effusion.  External ear canal normal.  No foreign body this noted.  No mastoid tenderness.  No erythema or edema noted to external ear and surrounding areas.    Nose: Rhinorrhea present.     Comments: Bilateral edematous and erythematous nasal turbinates with scant clear nasal discharge.    Mouth/Throat:     Mouth: Mucous membranes are moist.     Pharynx: No oropharyngeal exudate or posterior oropharyngeal erythema.     Comments: No abscess or edema palpated.  No erythema, lesion, exudate noted.  Uvula midline.  No trismus.  Airway patent. Eyes:     General: No scleral icterus. Cardiovascular:     Rate and  Rhythm: Normal rate and regular rhythm.  Pulmonary:     Effort: Pulmonary effort is normal. No respiratory distress.     Breath sounds: Normal breath sounds.  Abdominal:     General: Abdomen is flat. Bowel sounds are normal.     Palpations: Abdomen is soft.  Musculoskeletal:        General: No deformity.     Cervical back: Normal range of motion. No tenderness.  Lymphadenopathy:     Cervical: No cervical adenopathy.  Skin:    General: Skin is warm and dry.  Neurological:     General: No focal deficit present.     Mental Status: She is alert. Mental status is at baseline.    ED Results / Procedures / Treatments   Labs (all labs ordered are listed, but only abnormal results are displayed) Labs Reviewed - No data to display  EKG None  Radiology No results found.  Procedures Procedures   Medications Ordered in ED Medications - No data to display  ED Course  I have reviewed the triage vital signs and the nursing notes.  Pertinent labs & imaging results that were available during my care of the patient were reviewed by me and considered in my medical decision making (see chart for details).  Darlene Thomas is a 30 year old female presenting with bilateral ear pain for 2 days.  She has a "longstanding history of ear problems".  The patient has a mildly erythematous right TM with effusion.  Although she denies any nasal congestion, her nose is erythematous with a moderate amount of bilateral nasal turbinate edema and scant nasal discharge.  There are no signs of a ruptured TM or mastoiditis. I recommended the patient try Flonase for 3 days before trying the antibiotic prescription as her physical exam findings in her right ear is most likely due to congestion.  Discussed medications with patient.  Return precautions given. Patient agrees to plan.  Patient stable and being discharged home with a condition.   MDM Rules/Calculators/A&P                          Final Clinical  Impression(s) / ED Diagnoses Final diagnoses:  Acute suppurative otitis media of right ear without spontaneous rupture of tympanic membrane, recurrence not specified  Nasal congestion    Rx / DC Orders ED Discharge Orders          Ordered    fluticasone (FLONASE) 50 MCG/ACT nasal spray  Daily        12/13/20 1141    amoxicillin-clavulanate (AUGMENTIN) 875-125 MG tablet  Every 12 hours        12/13/20 1141  Achille Rich, PA-C 12/13/20 1620    Tegeler, Canary Brim, MD 12/14/20 [phone]

## 2020-12-13 NOTE — ED Triage Notes (Addendum)
Pt having bilateral ear pain with decreased hearing and drainage from left ear for 2 days.  No known fever.  No other symptoms.

## 2020-12-13 NOTE — Discharge Instructions (Addendum)
You were seen here today for bilateral ear pain.  Your symptoms are most likely associated with congestion.  You been prescribed Flonase to help with nasal congestion.  Please try this for 3 days before filling your antibiotic for ear pain.  Tylenol or ibuprofen can be taken for pain as needed.  Catches more information on congestion.  Referral to ENT attached.  If you have any fevers, loss of hearing, purulent discharge, or any other concern/new/worsening symptoms, please return to the ED.

## 2021-04-22 IMAGING — CT CT NECK W/ CM
4 series · 15 of 33 positions shown, 18 images · IV contrast (Omnipaque)
Comparison: None.

CLINICAL DATA: Lymphadenopathy

EXAM:
CT NECK WITH CONTRAST
TECHNIQUE: Multidetector CT imaging of the neck was performed using the
standard protocol following the bolus administration of intravenous
contrast.
CONTRAST:  75mL OMNIPAQUE IOHEXOL 300 MG/ML  SOLN

[Series 3: axial neck · axial · 0.55mm/px · z∈[-248,-104]mm · 5 of 108 slices shown, 7 images]
[im 18/108  soft-tissue]
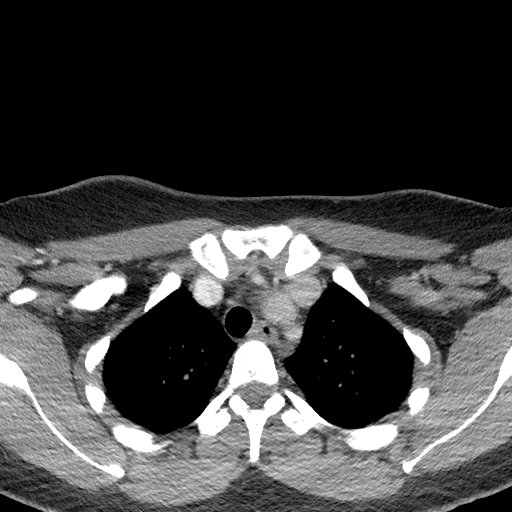
[im 18/108  bone]
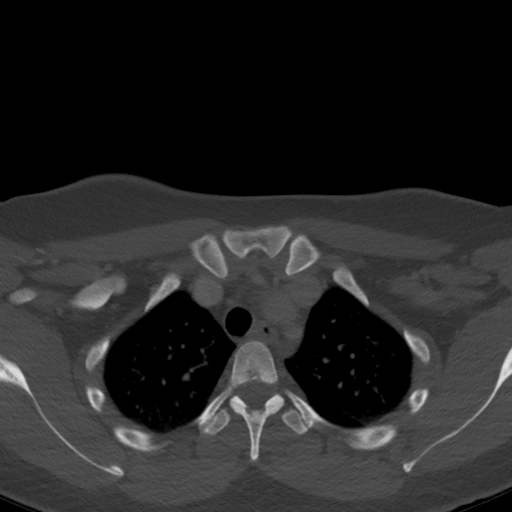
[im 36/108  bone]
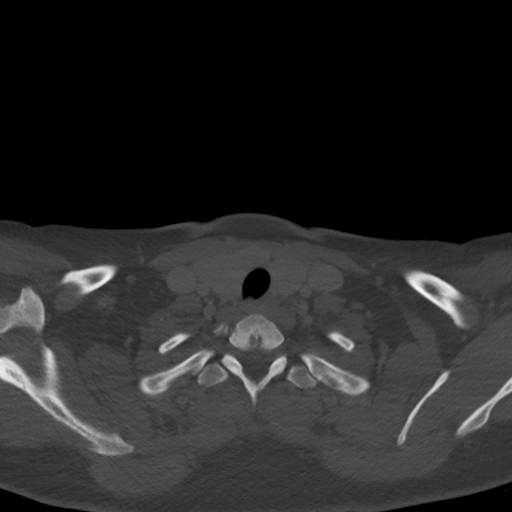
[im 54/108  bone]
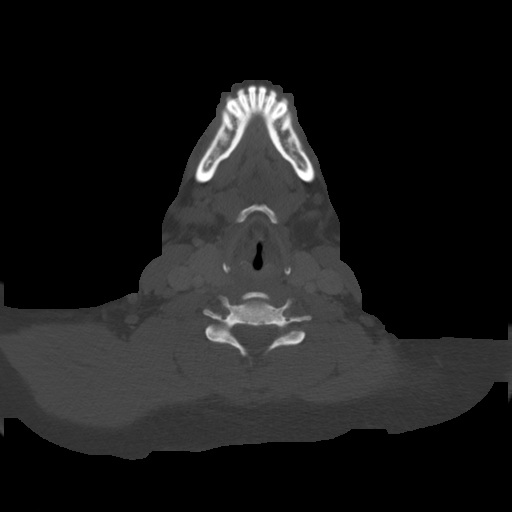
[im 72/108  bone]
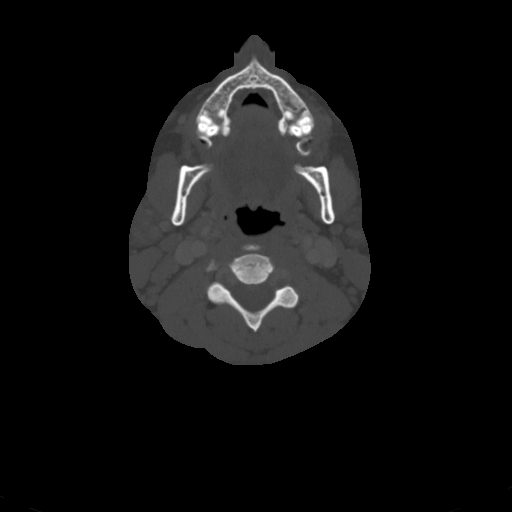
[im 90/108  soft-tissue]
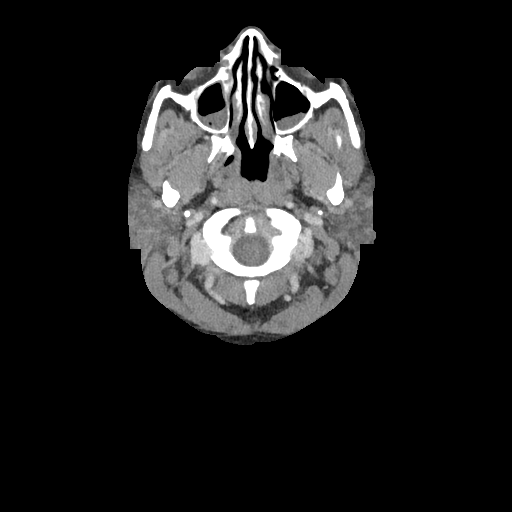
[im 90/108  bone]
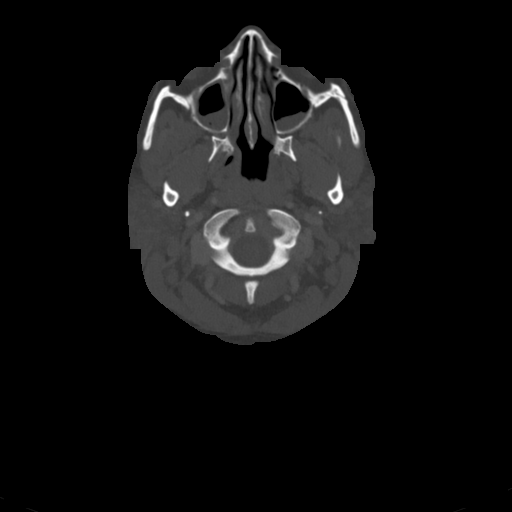

[Series 6: sag neck · sagittal · 0.54mm/px · 5 of 127 slices shown, 6 images]
[im 43/127  bone]
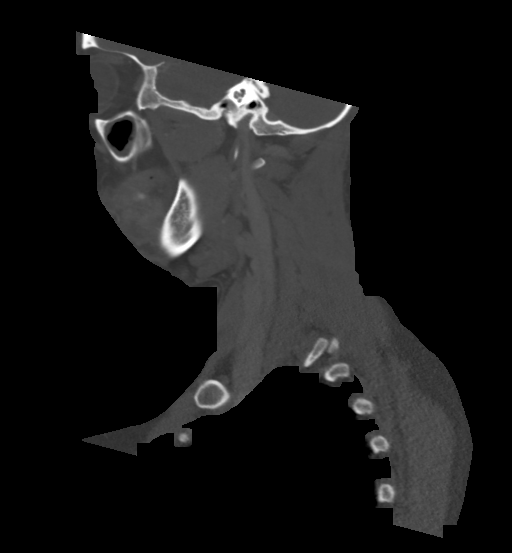
[im 53/127  bone]
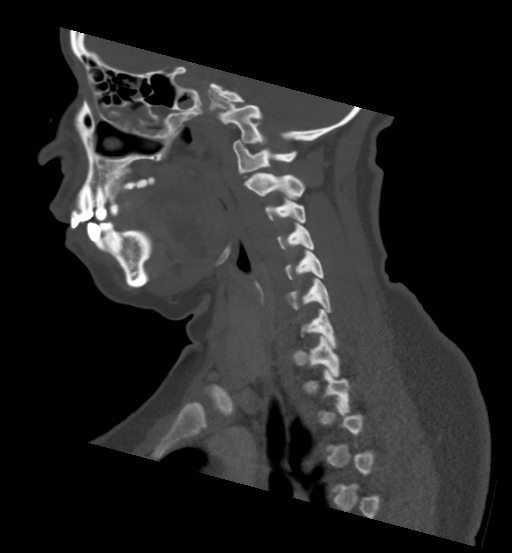
[im 64/127  soft-tissue]
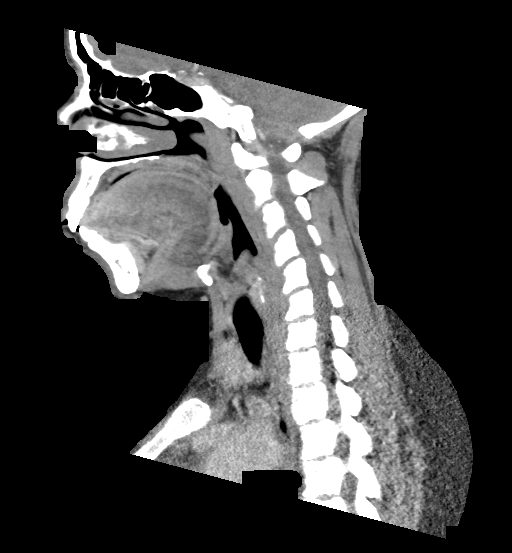
[im 64/127  bone]
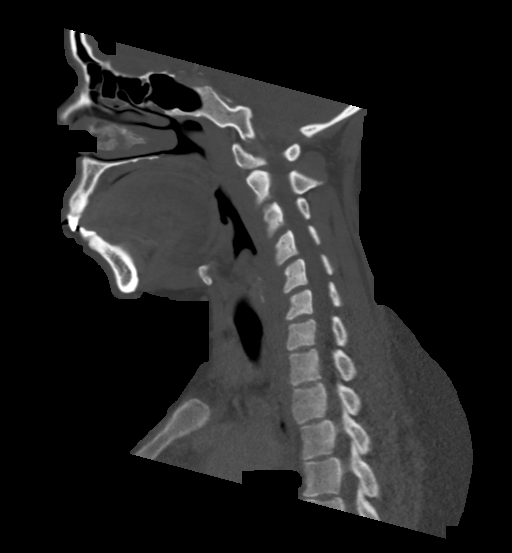
[im 74/127  bone]
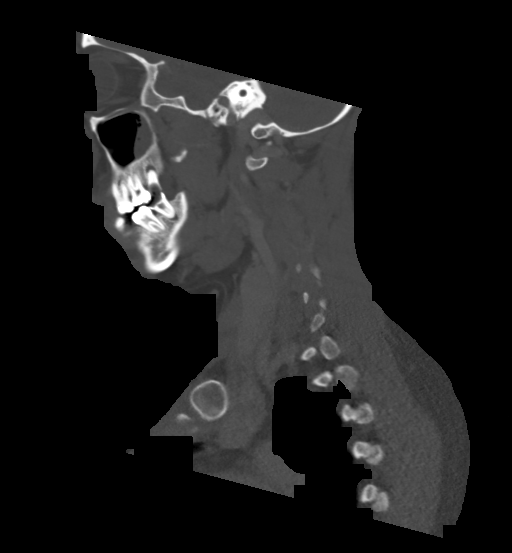
[im 85/127  bone]
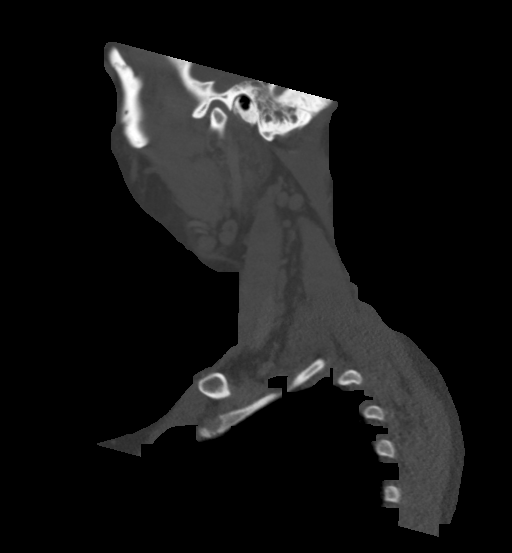

[Series 7: cor neck · coronal · 0.48mm/px · 3 of 109 slices shown]
[im 32/109  bone]
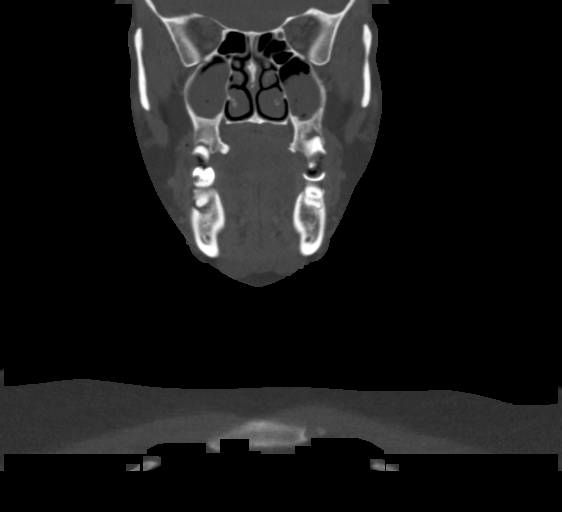
[im 47/109  bone]
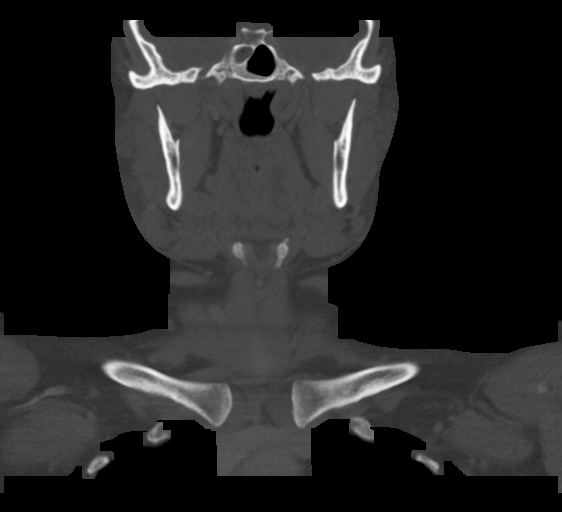
[im 62/109  bone]
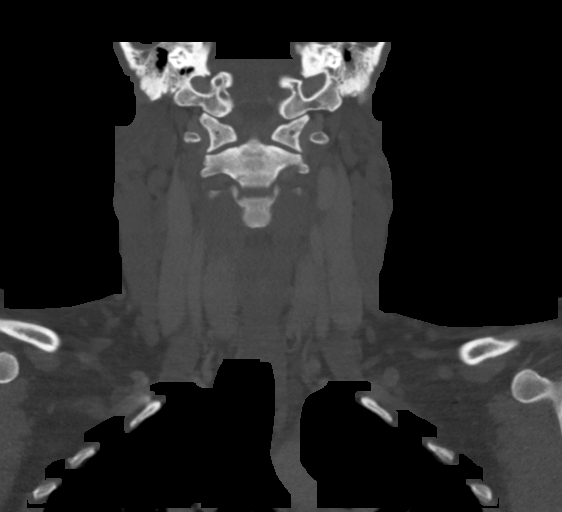

[Series 8: orthogonal ax · axial · 0.51mm/px · z∈[-275,-239]mm · 2 of 112 slices shown]
[im 19/112  bone]
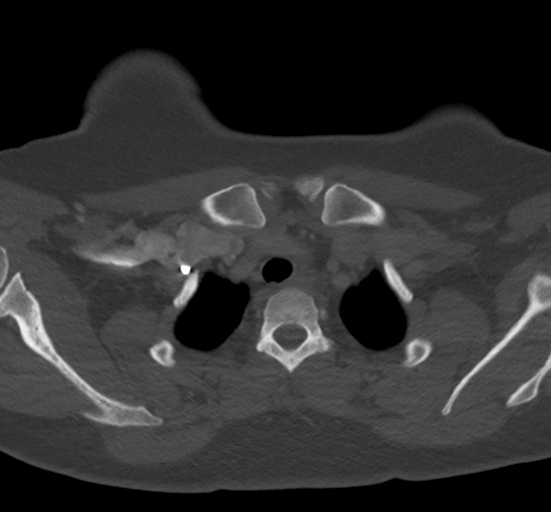
[im 38/112  bone]
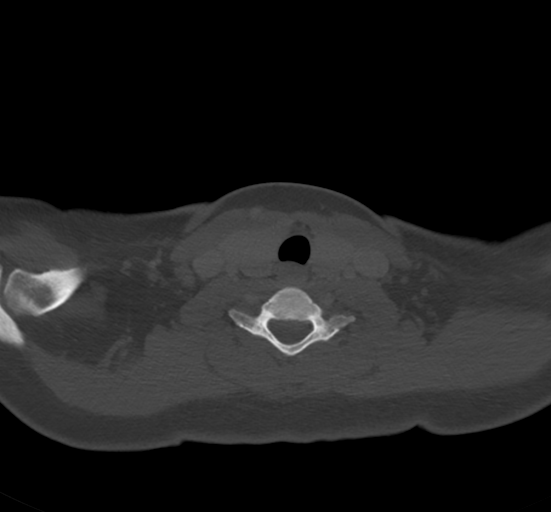

[15 of 33 positions shown; findings below may reference images not displayed]

FINDINGS: Pharynx and larynx: Normal. No mass or swelling. Clear
parapharyngeal spaces.

Salivary glands: Normal appearance of the left parotid and bilateral
submandibular glands. Asymmetric enlargement of the right parotid
gland with diffuse intraparotid stranding. Prominent intraparotid
nodes measure up to 6 mm ([DATE]).

Thyroid: Prominent thyroid.  No focal lesion.

Lymph nodes: Bilateral level 1B and right level 2A/2B nodes.
Reniform hyperdensity within the right level 1 B region measuring
1.7 x 1.3 cm ([DATE]) likely reflects an enlarged node. Prominent
right level 3, 4 and 5 nodes.

Vascular: Normal intravascular enhancement.

Limited intracranial: Grossly unremarkable.

Visualized orbits: Normal orbits.

Mastoids and visualized paranasal sinuses: Layering bilateral
maxillary and right sphenoid sinus secretions likely reflects active
inflammation. Mild ethmoid sinus mucosal thickening. Bilateral
mastoid effusions.

Skeleton: No acute or suspicious osseous abnormalities.

Upper chest: Atelectasis.

Other: None.
IMPRESSION: Sequela of right parotiditis.

Right predominant cervical adenopathy is likely reactive. Enlarged
right level 1B node measuring 1.7 cm likely reflects a reactive
node.

Follow-up imaging in 4-6 weeks is recommended to ensure resolution
of adenopathy. If adenopathy persists consider tissue sampling.
